# Patient Record
Sex: Female | Born: 2000
Health system: Southern US, Community
[De-identification: ages and names within clinical notes are randomized; demographics above are authoritative.]

## PROBLEM LIST (undated history)

## (undated) DIAGNOSIS — G43909 Migraine, unspecified, not intractable, without status migrainosus: Secondary | ICD-10-CM

---
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
## 2021-04-01 DIAGNOSIS — L578 Other skin changes due to chronic exposure to nonionizing radiation: Secondary | ICD-10-CM | POA: Diagnosis not present

## 2021-04-01 DIAGNOSIS — D224 Melanocytic nevi of scalp and neck: Secondary | ICD-10-CM | POA: Diagnosis not present

## 2021-04-01 DIAGNOSIS — D398 Neoplasm of uncertain behavior of other specified female genital organs: Secondary | ICD-10-CM | POA: Diagnosis not present

## 2021-04-01 DIAGNOSIS — D2261 Melanocytic nevi of right upper limb, including shoulder: Secondary | ICD-10-CM | POA: Diagnosis not present

## 2021-04-01 DIAGNOSIS — D225 Melanocytic nevi of trunk: Secondary | ICD-10-CM | POA: Diagnosis not present

## 2021-04-01 DIAGNOSIS — D485 Neoplasm of uncertain behavior of skin: Secondary | ICD-10-CM | POA: Diagnosis not present

## 2021-04-01 DIAGNOSIS — D2262 Melanocytic nevi of left upper limb, including shoulder: Secondary | ICD-10-CM | POA: Diagnosis not present

## 2021-07-15 ENCOUNTER — Other Ambulatory Visit: Payer: Self-pay

## 2021-07-15 ENCOUNTER — Ambulatory Visit (INDEPENDENT_AMBULATORY_CARE_PROVIDER_SITE_OTHER): Payer: 59

## 2021-07-15 DIAGNOSIS — Z23 Encounter for immunization: Secondary | ICD-10-CM

## 2021-07-28 ENCOUNTER — Other Ambulatory Visit: Payer: Self-pay

## 2021-07-28 ENCOUNTER — Ambulatory Visit: Payer: Self-pay | Attending: Internal Medicine

## 2021-07-28 DIAGNOSIS — Z23 Encounter for immunization: Secondary | ICD-10-CM

## 2021-07-28 MED ORDER — PFIZER COVID-19 VAC BIVALENT 30 MCG/0.3ML IM SUSP
INTRAMUSCULAR | 0 refills | Status: AC
  Filled 2021-07-28: qty 0.3, 1d supply, fill #0

## 2021-07-28 NOTE — Progress Notes (Signed)
   Covid-19 Vaccination Clinic  Name:  Adline Kirshenbaum    MRN: [id number] DOB: 04-07-2001  07/28/2021  Ms. Bohr was observed post Covid-19 immunization for 15 minutes without incident. She was provided with Vaccine Information Sheet and instruction to access the V-Safe system.   Ms. Swenor was instructed to call 911 with any severe reactions post vaccine: Difficulty breathing  Swelling of face and throat  A fast heartbeat  A bad rash all over body  Dizziness and weakness   Lu Duffel, PharmD, MBA Clinical Acute Care Pharmacist

## 2021-09-24 DIAGNOSIS — L578 Other skin changes due to chronic exposure to nonionizing radiation: Secondary | ICD-10-CM | POA: Diagnosis not present

## 2021-09-24 DIAGNOSIS — D225 Melanocytic nevi of trunk: Secondary | ICD-10-CM | POA: Diagnosis not present

## 2022-08-18 ENCOUNTER — Ambulatory Visit (INDEPENDENT_AMBULATORY_CARE_PROVIDER_SITE_OTHER): Payer: 59

## 2022-08-18 DIAGNOSIS — Z23 Encounter for immunization: Secondary | ICD-10-CM

## 2022-09-24 DIAGNOSIS — L578 Other skin changes due to chronic exposure to nonionizing radiation: Secondary | ICD-10-CM | POA: Diagnosis not present

## 2022-09-24 DIAGNOSIS — Z86018 Personal history of other benign neoplasm: Secondary | ICD-10-CM | POA: Diagnosis not present

## 2022-09-24 DIAGNOSIS — Z09 Encounter for follow-up examination after completed treatment for conditions other than malignant neoplasm: Secondary | ICD-10-CM | POA: Diagnosis not present

## 2022-09-24 DIAGNOSIS — D225 Melanocytic nevi of trunk: Secondary | ICD-10-CM | POA: Diagnosis not present

## 2023-03-21 ENCOUNTER — Telehealth: Payer: Self-pay

## 2023-03-21 NOTE — Telephone Encounter (Signed)
LVM. Also sending mychart msg. AS, CMA
